# Patient Record
Sex: Male | Born: 1985 | Race: White | Hispanic: No | Smoking: Current every day smoker
Health system: Southern US, Community
[De-identification: ages and names within clinical notes are randomized; demographics above are authoritative.]

## PROBLEM LIST (undated history)

## (undated) DIAGNOSIS — M797 Fibromyalgia: Secondary | ICD-10-CM

## (undated) DIAGNOSIS — F329 Major depressive disorder, single episode, unspecified: Secondary | ICD-10-CM

## (undated) DIAGNOSIS — F32A Depression, unspecified: Secondary | ICD-10-CM

## (undated) DIAGNOSIS — F1910 Other psychoactive substance abuse, uncomplicated: Secondary | ICD-10-CM

## (undated) HISTORY — PX: FACIAL FRACTURE SURGERY: SHX1570

---

## 2009-09-20 ENCOUNTER — Emergency Department: Payer: Self-pay | Admitting: Emergency Medicine

## 2010-02-27 ENCOUNTER — Inpatient Hospital Stay (HOSPITAL_COMMUNITY): Admission: EM | Admit: 2010-02-27 | Discharge: 2010-03-02 | Payer: Self-pay | Admitting: Emergency Medicine

## 2010-02-27 ENCOUNTER — Encounter: Payer: Self-pay | Admitting: Emergency Medicine

## 2010-04-15 ENCOUNTER — Ambulatory Visit (HOSPITAL_COMMUNITY): Admission: RE | Admit: 2010-04-15 | Discharge: 2010-04-15 | Payer: Self-pay | Admitting: Otolaryngology

## 2011-02-07 LAB — CBC
Hemoglobin: 15.9 g/dL (ref 13.0–17.0)
MCHC: 34 g/dL (ref 30.0–36.0)
MCV: 88.2 fL (ref 78.0–100.0)
RBC: 5.3 MIL/uL (ref 4.22–5.81)
RDW: 13.2 % (ref 11.5–15.5)
WBC: 7.3 10*3/uL (ref 4.0–10.5)

## 2012-05-03 ENCOUNTER — Emergency Department: Payer: Self-pay | Admitting: Unknown Physician Specialty

## 2012-05-03 LAB — CBC WITH DIFFERENTIAL/PLATELET
Basophil #: 0.1 10*3/uL (ref 0.0–0.1)
Basophil %: 0.7 %
HCT: 55.9 % — ABNORMAL HIGH (ref 40.0–52.0)
Lymphocyte %: 17.5 %
MCH: 28.7 pg (ref 26.0–34.0)
MCHC: 33.8 g/dL (ref 32.0–36.0)
MCV: 85 fL (ref 80–100)
Monocyte %: 9.1 %
Neutrophil #: 5.6 10*3/uL (ref 1.4–6.5)
RBC: 6.58 10*6/uL — ABNORMAL HIGH (ref 4.40–5.90)
RDW: 13.1 % (ref 11.5–14.5)
WBC: 7.9 10*3/uL (ref 3.8–10.6)

## 2012-05-03 LAB — DRUG SCREEN, URINE
Amphetamines, Ur Screen: NEGATIVE (ref ?–1000)
Barbiturates, Ur Screen: NEGATIVE (ref ?–200)
Benzodiazepine, Ur Scrn: NEGATIVE (ref ?–200)
Cannabinoid 50 Ng, Ur ~~LOC~~: NEGATIVE (ref ?–50)
Cocaine Metabolite,Ur ~~LOC~~: NEGATIVE (ref ?–300)
MDMA (Ecstasy)Ur Screen: NEGATIVE (ref ?–500)

## 2012-05-03 LAB — URINALYSIS, COMPLETE
Bilirubin,UR: NEGATIVE
Glucose,UR: NEGATIVE mg/dL (ref 0–75)
Hyaline Cast: 1
Leukocyte Esterase: NEGATIVE
Nitrite: NEGATIVE
Ph: 6 (ref 4.5–8.0)
Protein: 30
Specific Gravity: 1.013 (ref 1.003–1.030)
WBC UR: <1 /HPF (ref 0–5)

## 2012-05-03 LAB — BASIC METABOLIC PANEL
BUN: 9 mg/dL (ref 7–18)
Calcium, Total: 9.5 mg/dL (ref 8.5–10.1)
Co2: 28 mmol/L (ref 21–32)
Creatinine: 1.22 mg/dL (ref 0.60–1.30)
EGFR (Non-African Amer.): >60
Glucose: 72 mg/dL (ref 65–99)
Osmolality: 273 (ref 275–301)
Potassium: 4 mmol/L (ref 3.5–5.1)

## 2012-05-03 LAB — MAGNESIUM: Magnesium: 2.4 mg/dL

## 2012-05-03 LAB — HEPATIC FUNCTION PANEL A (ARMC)
Albumin: 4.1 g/dL (ref 3.4–5.0)
SGPT (ALT): 66 U/L

## 2012-05-03 LAB — CK TOTAL AND CKMB (NOT AT ARMC)
CK, Total: 455 U/L — ABNORMAL HIGH (ref 35–232)
CK-MB: 5.7 ng/mL — ABNORMAL HIGH (ref 0.5–3.6)

## 2012-06-14 ENCOUNTER — Emergency Department: Payer: Self-pay | Admitting: Emergency Medicine

## 2012-06-15 LAB — URINALYSIS, COMPLETE
Bilirubin,UR: NEGATIVE
Blood: NEGATIVE
Glucose,UR: NEGATIVE mg/dL (ref 0–75)
Ketone: NEGATIVE
Leukocyte Esterase: NEGATIVE
Nitrite: NEGATIVE
Ph: 6 (ref 4.5–8.0)
RBC,UR: 1 /HPF (ref 0–5)

## 2012-06-15 LAB — BASIC METABOLIC PANEL
Calcium, Total: 8.8 mg/dL (ref 8.5–10.1)
EGFR (African American): >60
EGFR (Non-African Amer.): >60
Sodium: 138 mmol/L (ref 136–145)

## 2012-06-15 LAB — CBC WITH DIFFERENTIAL/PLATELET
Basophil #: 0 10*3/uL (ref 0.0–0.1)
Basophil %: 0.5 %
Eosinophil #: 0.1 10*3/uL (ref 0.0–0.7)
Lymphocyte %: 27.9 %
MCH: 30 pg (ref 26.0–34.0)
MCV: 88 fL (ref 80–100)
Monocyte %: 13.1 %
Neutrophil %: 57.4 %
Platelet: 282 10*3/uL (ref 150–440)
RBC: 6.05 10*6/uL — ABNORMAL HIGH (ref 4.40–5.90)
WBC: 7.5 10*3/uL (ref 3.8–10.6)

## 2012-06-15 LAB — DRUG SCREEN, URINE
Amphetamines, Ur Screen: NEGATIVE (ref ?–1000)
Barbiturates, Ur Screen: NEGATIVE (ref ?–200)
Benzodiazepine, Ur Scrn: NEGATIVE (ref ?–200)
MDMA (Ecstasy)Ur Screen: NEGATIVE (ref ?–500)
Methadone, Ur Screen: NEGATIVE (ref ?–300)
Opiate, Ur Screen: NEGATIVE (ref ?–300)
Tricyclic, Ur Screen: NEGATIVE (ref ?–1000)

## 2012-10-16 ENCOUNTER — Emergency Department: Payer: Self-pay | Admitting: Emergency Medicine

## 2012-10-31 ENCOUNTER — Emergency Department: Payer: Self-pay | Admitting: Emergency Medicine

## 2013-01-23 ENCOUNTER — Emergency Department: Payer: Self-pay | Admitting: Internal Medicine

## 2013-01-25 ENCOUNTER — Emergency Department: Payer: Self-pay | Admitting: Emergency Medicine

## 2013-03-09 ENCOUNTER — Emergency Department: Payer: Self-pay | Admitting: Unknown Physician Specialty

## 2013-03-09 LAB — COMPREHENSIVE METABOLIC PANEL
Albumin: 3.9 g/dL (ref 3.4–5.0)
Alkaline Phosphatase: 85 U/L (ref 50–136)
BUN: 12 mg/dL (ref 7–18)
Calcium, Total: 9.1 mg/dL (ref 8.5–10.1)
Chloride: 102 mmol/L (ref 98–107)
Co2: 26 mmol/L (ref 21–32)
EGFR (Non-African Amer.): >60
Osmolality: 269 (ref 275–301)
SGOT(AST): 26 U/L (ref 15–37)
SGPT (ALT): 24 U/L (ref 12–78)

## 2013-03-09 LAB — CBC WITH DIFFERENTIAL/PLATELET
Eosinophil #: 0.1 10*3/uL (ref 0.0–0.7)
HCT: 52.6 % — ABNORMAL HIGH (ref 40.0–52.0)
HGB: 17.7 g/dL (ref 13.0–18.0)
MCH: 29.7 pg (ref 26.0–34.0)
MCHC: 33.7 g/dL (ref 32.0–36.0)
MCV: 88 fL (ref 80–100)
Neutrophil %: 62.1 %
Platelet: 267 10*3/uL (ref 150–440)
RDW: 12.9 % (ref 11.5–14.5)
WBC: 8.4 10*3/uL (ref 3.8–10.6)

## 2013-03-09 LAB — DRUG SCREEN, URINE
Barbiturates, Ur Screen: NEGATIVE (ref ?–200)
Benzodiazepine, Ur Scrn: NEGATIVE (ref ?–200)
Cannabinoid 50 Ng, Ur ~~LOC~~: NEGATIVE (ref ?–50)
Cocaine Metabolite,Ur ~~LOC~~: NEGATIVE (ref ?–300)
MDMA (Ecstasy)Ur Screen: NEGATIVE (ref ?–500)
Opiate, Ur Screen: NEGATIVE (ref ?–300)

## 2013-03-10 LAB — URINALYSIS, COMPLETE
Bacteria: NONE SEEN
Bilirubin,UR: NEGATIVE
Blood: NEGATIVE
Leukocyte Esterase: NEGATIVE
Ph: 6 (ref 4.5–8.0)
Protein: 30
Specific Gravity: 1.017 (ref 1.003–1.030)
WBC UR: 1 /HPF (ref 0–5)

## 2013-03-19 ENCOUNTER — Emergency Department: Payer: Self-pay | Admitting: Emergency Medicine

## 2013-03-20 ENCOUNTER — Emergency Department: Payer: Self-pay | Admitting: Internal Medicine

## 2013-04-09 ENCOUNTER — Emergency Department: Payer: Self-pay | Admitting: Emergency Medicine

## 2013-04-26 ENCOUNTER — Emergency Department: Payer: Self-pay | Admitting: Emergency Medicine

## 2013-05-02 ENCOUNTER — Emergency Department: Payer: Self-pay | Admitting: Emergency Medicine

## 2013-05-02 LAB — DRUG SCREEN, URINE
Amphetamines, Ur Screen: NEGATIVE (ref ?–1000)
Barbiturates, Ur Screen: NEGATIVE (ref ?–200)
Benzodiazepine, Ur Scrn: NEGATIVE (ref ?–200)
Cocaine Metabolite,Ur ~~LOC~~: NEGATIVE (ref ?–300)
Opiate, Ur Screen: NEGATIVE (ref ?–300)
Tricyclic, Ur Screen: NEGATIVE (ref ?–1000)

## 2013-05-02 LAB — COMPREHENSIVE METABOLIC PANEL
Anion Gap: 8 (ref 7–16)
BUN: 10 mg/dL (ref 7–18)
Bilirubin,Total: 0.4 mg/dL (ref 0.2–1.0)
Calcium, Total: 9.4 mg/dL (ref 8.5–10.1)
Co2: 29 mmol/L (ref 21–32)
Creatinine: 1.12 mg/dL (ref 0.60–1.30)
EGFR (African American): >60
EGFR (Non-African Amer.): >60
SGOT(AST): 14 U/L — ABNORMAL LOW (ref 15–37)
Total Protein: 7.4 g/dL (ref 6.4–8.2)

## 2013-05-02 LAB — CBC
HCT: 49.8 % (ref 40.0–52.0)
HGB: 17.2 g/dL (ref 13.0–18.0)
Platelet: 238 10*3/uL (ref 150–440)

## 2013-05-02 LAB — URINALYSIS, COMPLETE
Bacteria: NONE SEEN
Blood: NEGATIVE
Glucose,UR: NEGATIVE mg/dL (ref 0–75)
Leukocyte Esterase: NEGATIVE
Nitrite: NEGATIVE
Ph: 7 (ref 4.5–8.0)
Protein: NEGATIVE
WBC UR: <1 /HPF (ref 0–5)

## 2013-05-02 LAB — ETHANOL: Ethanol %: <0.003 % (ref 0.000–0.080)

## 2013-06-15 ENCOUNTER — Emergency Department: Payer: Self-pay | Admitting: Emergency Medicine

## 2013-06-17 ENCOUNTER — Emergency Department: Payer: Self-pay | Admitting: Emergency Medicine

## 2013-06-17 LAB — COMPREHENSIVE METABOLIC PANEL
Albumin: 4.1 g/dL (ref 3.4–5.0)
Alkaline Phosphatase: 93 U/L (ref 50–136)
BUN: 10 mg/dL (ref 7–18)
Bilirubin,Total: 0.2 mg/dL (ref 0.2–1.0)
Chloride: 109 mmol/L — ABNORMAL HIGH (ref 98–107)
Creatinine: 1.02 mg/dL (ref 0.60–1.30)
EGFR (African American): >60
Osmolality: 287 (ref 275–301)
Sodium: 145 mmol/L (ref 136–145)

## 2013-06-17 LAB — CBC
HCT: 47.4 % (ref 40.0–52.0)
MCH: 29 pg (ref 26.0–34.0)
MCHC: 34.2 g/dL (ref 32.0–36.0)
Platelet: 248 10*3/uL (ref 150–440)
RBC: 5.59 10*6/uL (ref 4.40–5.90)
RDW: 13.3 % (ref 11.5–14.5)

## 2013-06-17 LAB — URINALYSIS, COMPLETE
Bacteria: NONE SEEN
Bilirubin,UR: NEGATIVE
Blood: NEGATIVE
Glucose,UR: NEGATIVE mg/dL (ref 0–75)
Ketone: NEGATIVE
Leukocyte Esterase: NEGATIVE
Ph: 7 (ref 4.5–8.0)
Protein: NEGATIVE
RBC,UR: 1 /HPF (ref 0–5)
WBC UR: 1 /HPF (ref 0–5)

## 2013-06-17 LAB — TSH: Thyroid Stimulating Horm: 1.37 u[IU]/mL

## 2013-06-17 LAB — DRUG SCREEN, URINE
Amphetamines, Ur Screen: NEGATIVE (ref ?–1000)
Barbiturates, Ur Screen: NEGATIVE (ref ?–200)
Benzodiazepine, Ur Scrn: NEGATIVE (ref ?–200)
Cannabinoid 50 Ng, Ur ~~LOC~~: NEGATIVE (ref ?–50)
Cocaine Metabolite,Ur ~~LOC~~: NEGATIVE (ref ?–300)
MDMA (Ecstasy)Ur Screen: POSITIVE (ref ?–500)
Phencyclidine (PCP) Ur S: NEGATIVE (ref ?–25)
Tricyclic, Ur Screen: NEGATIVE (ref ?–1000)

## 2013-06-17 LAB — ETHANOL
Ethanol %: <0.003 % (ref 0.000–0.080)
Ethanol: <3 mg/dL

## 2013-06-24 ENCOUNTER — Emergency Department: Payer: Self-pay | Admitting: Emergency Medicine

## 2013-06-24 LAB — BASIC METABOLIC PANEL
Anion Gap: 11 (ref 7–16)
BUN: 13 mg/dL (ref 7–18)
Co2: 21 mmol/L (ref 21–32)
Creatinine: 1.04 mg/dL (ref 0.60–1.30)
EGFR (African American): >60
EGFR (Non-African Amer.): >60
Glucose: 74 mg/dL (ref 65–99)
Potassium: 4.5 mmol/L (ref 3.5–5.1)
Sodium: 136 mmol/L (ref 136–145)

## 2013-06-24 LAB — URINALYSIS, COMPLETE
Bilirubin,UR: NEGATIVE
Glucose,UR: NEGATIVE mg/dL (ref 0–75)
Leukocyte Esterase: NEGATIVE
Nitrite: NEGATIVE
RBC,UR: 2 /HPF (ref 0–5)
WBC UR: 3 /HPF (ref 0–5)

## 2013-06-24 LAB — CBC WITH DIFFERENTIAL/PLATELET
Basophil #: 0 10*3/uL (ref 0.0–0.1)
Basophil %: 0.3 %
HGB: 17.7 g/dL (ref 13.0–18.0)
Lymphocyte #: 1.6 10*3/uL (ref 1.0–3.6)
MCH: 30.3 pg (ref 26.0–34.0)
Monocyte #: 0.9 x10 3/mm (ref 0.2–1.0)
Monocyte %: 7.8 %
Neutrophil #: 8.3 10*3/uL — ABNORMAL HIGH (ref 1.4–6.5)
Neutrophil %: 76 %
Platelet: 229 10*3/uL (ref 150–440)
RBC: 5.83 10*6/uL (ref 4.40–5.90)

## 2013-07-02 ENCOUNTER — Emergency Department: Payer: Self-pay | Admitting: Emergency Medicine

## 2013-07-02 LAB — URINALYSIS, COMPLETE
Leukocyte Esterase: NEGATIVE
Nitrite: NEGATIVE
Protein: NEGATIVE
RBC,UR: NONE SEEN /HPF (ref 0–5)
Specific Gravity: 1.018 (ref 1.003–1.030)
Squamous Epithelial: NONE SEEN
WBC UR: NONE SEEN /HPF (ref 0–5)

## 2013-07-02 LAB — CBC
HGB: 17.1 g/dL (ref 13.0–18.0)
MCH: 29.7 pg (ref 26.0–34.0)
MCHC: 34.6 g/dL (ref 32.0–36.0)
MCV: 86 fL (ref 80–100)
Platelet: 235 10*3/uL (ref 150–440)
RBC: 5.75 10*6/uL (ref 4.40–5.90)
RDW: 13.4 % (ref 11.5–14.5)

## 2013-07-02 LAB — COMPREHENSIVE METABOLIC PANEL
Albumin: 4.3 g/dL (ref 3.4–5.0)
Anion Gap: 4 — ABNORMAL LOW (ref 7–16)
Bilirubin,Total: 0.4 mg/dL (ref 0.2–1.0)
Calcium, Total: 9.5 mg/dL (ref 8.5–10.1)
Chloride: 108 mmol/L — ABNORMAL HIGH (ref 98–107)
Co2: 29 mmol/L (ref 21–32)
EGFR (African American): >60
EGFR (Non-African Amer.): >60
Glucose: 126 mg/dL — ABNORMAL HIGH (ref 65–99)
Osmolality: 282 (ref 275–301)
Potassium: 3.8 mmol/L (ref 3.5–5.1)
SGOT(AST): 20 U/L (ref 15–37)
SGPT (ALT): 21 U/L (ref 12–78)
Sodium: 141 mmol/L (ref 136–145)
Total Protein: 7.5 g/dL (ref 6.4–8.2)

## 2013-07-02 LAB — DRUG SCREEN, URINE
Amphetamines, Ur Screen: NEGATIVE (ref ?–1000)
Barbiturates, Ur Screen: NEGATIVE (ref ?–200)
Benzodiazepine, Ur Scrn: NEGATIVE (ref ?–200)
Cocaine Metabolite,Ur ~~LOC~~: NEGATIVE (ref ?–300)
MDMA (Ecstasy)Ur Screen: NEGATIVE (ref ?–500)
Opiate, Ur Screen: POSITIVE (ref ?–300)
Phencyclidine (PCP) Ur S: NEGATIVE (ref ?–25)
Tricyclic, Ur Screen: NEGATIVE (ref ?–1000)

## 2013-07-02 LAB — ETHANOL: Ethanol: <3 mg/dL

## 2013-07-02 LAB — TSH: Thyroid Stimulating Horm: 1.28 u[IU]/mL

## 2013-11-08 ENCOUNTER — Emergency Department: Payer: Self-pay | Admitting: Emergency Medicine

## 2013-11-16 ENCOUNTER — Encounter (HOSPITAL_COMMUNITY): Payer: Self-pay | Admitting: Emergency Medicine

## 2013-11-16 ENCOUNTER — Emergency Department (HOSPITAL_COMMUNITY)
Admission: EM | Admit: 2013-11-16 | Discharge: 2013-11-16 | Disposition: A | Discharge status: Home / Self Care | Payer: Self-pay | Attending: Emergency Medicine | Admitting: Emergency Medicine

## 2013-11-16 DIAGNOSIS — K089 Disorder of teeth and supporting structures, unspecified: Secondary | ICD-10-CM | POA: Insufficient documentation

## 2013-11-16 DIAGNOSIS — Z8781 Personal history of (healed) traumatic fracture: Secondary | ICD-10-CM | POA: Insufficient documentation

## 2013-11-16 DIAGNOSIS — K0889 Other specified disorders of teeth and supporting structures: Secondary | ICD-10-CM

## 2013-11-16 DIAGNOSIS — K0381 Cracked tooth: Secondary | ICD-10-CM | POA: Insufficient documentation

## 2013-11-16 DIAGNOSIS — R609 Edema, unspecified: Secondary | ICD-10-CM | POA: Insufficient documentation

## 2013-11-16 MED ORDER — TRAMADOL HCL 50 MG PO TABS: 50.0000 mg | ORAL_TABLET | Freq: Four times a day (QID) | ORAL | Status: DC | PRN

## 2013-11-16 MED ORDER — PENICILLIN V POTASSIUM 500 MG PO TABS: 500.0000 mg | ORAL_TABLET | Freq: Four times a day (QID) | ORAL | Status: AC

## 2013-11-16 NOTE — ED Notes (Signed)
Pt states he has had extensive facial/dental surgery here, pt c/o L lower jaw pain after breaking tooth 4-6 weeks ago.

## 2013-11-16 NOTE — ED Notes (Signed)
Pt presents with c/o dental pain, upper left side of mouth.

## 2013-11-16 NOTE — ED Provider Notes (Signed)
CSN: [id number]     Arrival date & time 11/16/13  1909 History  This chart was scribed for non-physician practitioner, Ivonne Andrew, PA-C,working with Junius Argyle, MD, by Karle Plumber, ED Scribe.  This patient was seen in room WTR9/WTR9 and the patient's care was started at 10:35 PM.  Chief Complaint  Patient presents with  . Dental Pain   The history is provided by the patient. No language interpreter was used.   HPI Comments:  Matthew Gould is a 27 y.o. male who presents to the Emergency Department complaining of left jaw pain secondary to breaking the upper left 1st premolar approximately one month ago. Pt states for the past five days the throbbing pain has been worsening. He reports a bad taste in his mouth also. He reports using warm water rinses with no relief. He reports taking Motrin and Tylenol for pain with no relief. Pt states he has a Education officer, community in [place] but cannot go see her until he pays a bill secondary to extensive facial/dental surgery. He denies fever, chills, or diaphoresis. 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 No other aggravating or alleviating factors. No other associated symptoms.   History reviewed. No pertinent past medical history. Past Surgical History  Procedure Laterality Date  . Facial fracture surgery     No family history on file. History  Substance Use Topics  . Smoking status: Not on file  . Smokeless tobacco: Not on file  . Alcohol Use: Not on file    Review of Systems  Constitutional: Negative for fever and chills.  HENT: Positive for dental problem and facial swelling.   All other systems reviewed and are negative.   Allergies  Review of patient's allergies indicates no known allergies.  Home Medications   Current Outpatient Rx  Name  Route  Sig  Dispense  Refill  . acetaminophen (TYLENOL) 325 MG tablet   Oral   Take 650 mg by mouth every 6 (six) hours as needed for mild pain or moderate pain.         Marland Kitchen  ibuprofen (ADVIL,MOTRIN) 800 MG tablet   Oral   Take 800 mg by mouth every 8 (eight) hours as needed for mild pain or moderate pain.          Triage Vitals: BP 126/100  Pulse 128  Temp(Src) 98.7 F (37.1 C) (Oral)  Resp 18  Wt 155 lb (70.308 kg)  SpO2 100% Physical Exam  Nursing note and vitals reviewed. Constitutional: He is oriented to person, place, and time. He appears well-developed and well-nourished.  HENT:  Head: Normocephalic and atraumatic.  Mouth/Throat: Dentures: Broken and darkening of left upper premolar.   Broken and darkening of left upper premolar.  Eyes: EOM are normal.  Neck: Normal range of motion.  Cardiovascular: Normal rate.   Pulmonary/Chest: Effort normal.  Musculoskeletal: Normal range of motion.  Neurological: He is alert and oriented to person, place, and time.  Skin: Skin is warm and dry.  Psychiatric: He has a normal mood and affect. His behavior is normal.    ED Course  Procedures  DIAGNOSTIC STUDIES: Oxygen Saturation is 100% on RA, normal by my interpretation.   COORDINATION OF CARE: 10:43 PM- Will prescribe antibiotics. Offered pt a dental block but pt declined. Will give pt a dental referral. Pt verbalizes understanding and agrees to plan.    MDM   1. Pain, dental      I personally performed the services described in this  documentation, which was scribed in my presence. The recorded information has been reviewed and is accurate.    Angus Seller, PA-C 11/17/13 2245

## 2013-11-18 NOTE — ED Provider Notes (Signed)
Medical screening examination/treatment/procedure(s) were performed by non-physician practitioner and as supervising physician I was immediately available for consultation/collaboration.  EKG Interpretation   None         Junius Argyle, MD 11/18/13 1242

## 2014-01-03 ENCOUNTER — Encounter (HOSPITAL_COMMUNITY): Payer: Self-pay | Admitting: Emergency Medicine

## 2014-01-03 ENCOUNTER — Emergency Department (HOSPITAL_COMMUNITY)
Admission: EM | Admit: 2014-01-03 | Discharge: 2014-01-04 | Disposition: A | Discharge status: Home / Self Care | Payer: Self-pay | Attending: Emergency Medicine | Admitting: Emergency Medicine

## 2014-01-03 DIAGNOSIS — IMO0001 Reserved for inherently not codable concepts without codable children: Secondary | ICD-10-CM | POA: Insufficient documentation

## 2014-01-03 DIAGNOSIS — F329 Major depressive disorder, single episode, unspecified: Secondary | ICD-10-CM | POA: Insufficient documentation

## 2014-01-03 DIAGNOSIS — F17200 Nicotine dependence, unspecified, uncomplicated: Secondary | ICD-10-CM | POA: Insufficient documentation

## 2014-01-03 DIAGNOSIS — F1110 Opioid abuse, uncomplicated: Secondary | ICD-10-CM

## 2014-01-03 DIAGNOSIS — F3289 Other specified depressive episodes: Secondary | ICD-10-CM | POA: Insufficient documentation

## 2014-01-03 DIAGNOSIS — F1120 Opioid dependence, uncomplicated: Secondary | ICD-10-CM | POA: Insufficient documentation

## 2014-01-03 DIAGNOSIS — F1310 Sedative, hypnotic or anxiolytic abuse, uncomplicated: Secondary | ICD-10-CM | POA: Insufficient documentation

## 2014-01-03 DIAGNOSIS — Z79899 Other long term (current) drug therapy: Secondary | ICD-10-CM | POA: Insufficient documentation

## 2014-01-03 DIAGNOSIS — F1410 Cocaine abuse, uncomplicated: Secondary | ICD-10-CM | POA: Insufficient documentation

## 2014-01-03 HISTORY — DX: Major depressive disorder, single episode, unspecified: F32.9

## 2014-01-03 HISTORY — DX: Depression, unspecified: F32.A

## 2014-01-03 HISTORY — DX: Fibromyalgia: M79.7

## 2014-01-03 HISTORY — DX: Other psychoactive substance abuse, uncomplicated: F19.10

## 2014-01-03 LAB — RAPID URINE DRUG SCREEN, HOSP PERFORMED
AMPHETAMINES: NOT DETECTED
BENZODIAZEPINES: POSITIVE — AB
Barbiturates: NOT DETECTED
COCAINE: NOT DETECTED
OPIATES: POSITIVE — AB
Tetrahydrocannabinol: POSITIVE — AB

## 2014-01-03 LAB — COMPREHENSIVE METABOLIC PANEL
ALT: 16 U/L (ref 0–53)
AST: 24 U/L (ref 0–37)
Albumin: 4.7 g/dL (ref 3.5–5.2)
Alkaline Phosphatase: 86 U/L (ref 39–117)
BILIRUBIN TOTAL: 0.6 mg/dL (ref 0.3–1.2)
BUN: 9 mg/dL (ref 6–23)
CHLORIDE: 95 meq/L — AB (ref 96–112)
CO2: 27 mEq/L (ref 19–32)
CREATININE: 1.03 mg/dL (ref 0.50–1.35)
Calcium: 10.1 mg/dL (ref 8.4–10.5)
GFR calc Af Amer: >90 mL/min (ref >90)
GFR calc non Af Amer: >90 mL/min (ref >90)
Glucose, Bld: 104 mg/dL — ABNORMAL HIGH (ref 70–99)
Potassium: 3.8 mEq/L (ref 3.7–5.3)
Sodium: 136 mEq/L — ABNORMAL LOW (ref 137–147)
Total Protein: 8.6 g/dL — ABNORMAL HIGH (ref 6.0–8.3)

## 2014-01-03 LAB — CBC
HCT: 56.2 % — ABNORMAL HIGH (ref 39.0–52.0)
Hemoglobin: 19.7 g/dL — ABNORMAL HIGH (ref 13.0–17.0)
MCH: 30.3 pg (ref 26.0–34.0)
MCHC: 35.1 g/dL (ref 30.0–36.0)
MCV: 86.5 fL (ref 78.0–100.0)
PLATELETS: 263 10*3/uL (ref 150–400)
RBC: 6.5 MIL/uL — ABNORMAL HIGH (ref 4.22–5.81)
RDW: 13.3 % (ref 11.5–15.5)
WBC: 12.5 10*3/uL — AB (ref 4.0–10.5)

## 2014-01-03 LAB — ETHANOL: Alcohol, Ethyl (B): <11 mg/dL (ref 0–11)

## 2014-01-03 LAB — ACETAMINOPHEN LEVEL

## 2014-01-03 LAB — SALICYLATE LEVEL

## 2014-01-03 MED ORDER — NICOTINE 21 MG/24HR TD PT24: 21.0000 mg | MEDICATED_PATCH | Freq: Every day | TRANSDERMAL | Status: DC

## 2014-01-03 MED ORDER — CLONIDINE HCL 0.1 MG PO TABS: 0.1000 mg | ORAL_TABLET | Freq: Once | ORAL | Status: DC

## 2014-01-03 MED ORDER — ZOLPIDEM TARTRATE 5 MG PO TABS: 5.0000 mg | ORAL_TABLET | Freq: Every evening | ORAL | Status: DC | PRN

## 2014-01-03 MED ORDER — ONDANSETRON 4 MG PO TBDP: 8.0000 mg | ORAL_TABLET | Freq: Once | ORAL | Status: DC

## 2014-01-03 MED ORDER — ONDANSETRON HCL 4 MG PO TABS
4.0000 mg | ORAL_TABLET | Freq: Three times a day (TID) | ORAL | Status: DC | PRN
  Administered 2014-01-04: 4 mg via ORAL
  Filled 2014-01-03: qty 1

## 2014-01-03 MED ORDER — LORAZEPAM 1 MG PO TABS
1.0000 mg | ORAL_TABLET | Freq: Three times a day (TID) | ORAL | Status: DC | PRN
  Administered 2014-01-04: 1 mg via ORAL
  Filled 2014-01-03: qty 1

## 2014-01-03 NOTE — ED Notes (Signed)
Requests detox from oxycodone.  Last had 60 mg at 8am.  C/o generalized pain all over from fibromyalgia.  Denies etoh.  Denies sucidal/homicidal ideation.

## 2014-01-03 NOTE — BH Assessment (Signed)
Matthew Gould Kelly, PA working with pt to gather additional information prior to assessing pt. Contacted Matthew Gould, assigned nurse who agreed to set up tele-assessment. Assessment to be initiated.  Matthew Gould, MSW, LCSW Triage Specialist [phone]

## 2014-01-03 NOTE — BH Assessment (Signed)
Clinician consulted with Matthew SamFran Hobson NP who states Pt meets criteria for inpatient detox. Matthew Gould, [name]. Clinician contacted Matthew Gould, [place] with recommendation.TTS will look for alternative placements.    Matthew Gould, MSW, LCSW Triage Specialist [phone]

## 2014-01-03 NOTE — ED Provider Notes (Signed)
CSN: [id number]     Arrival date & time 01/03/14  1907 History   First MD Initiated Contact with Patient 01/03/14 2025     Chief Complaint  Patient presents with  . detox     (Consider location/radiation/quality/duration/timing/severity/associated sxs/prior Treatment) HPI Comments: Patient is a 28 year old male who presents for detox and placement in a rehabilitation facility for substance abuse. Patient states that he has been using oxycodone for some time. He endorses daily use of approximately 200-300 mg twice a day. Patient takes oxycodone orally; he has been snorting it as well for the last 2 weeks. Patient endorses cocaine use one week ago as well as alcohol use one week ago, though he states he does not usually use illicit drugs or alcohol. He denies suicidal and homicidal ideations as well as auditory or visual hallucinations. He has no pain complaints at this time. He denies any withdrawal symptoms at present. Patient last used oxycodone approximately 12 hours ago. He denies prior treatment in a rehabilitation facility.  The history is provided by the patient. No language interpreter was used.    Past Medical History  Diagnosis Date  . Drug abuse   . Fibromyalgia   . Depression    Past Surgical History  Procedure Laterality Date  . Facial fracture surgery     No family history on file. History  Substance Use Topics  . Smoking status: Current Every Day Smoker  . Smokeless tobacco: Not on file  . Alcohol Use: Yes    Review of Systems  Psychiatric/Behavioral: Positive for behavioral problems (substance abuse).  All other systems reviewed and are negative.     Allergies  Review of patient's allergies indicates no known allergies.  Home Medications   Current Outpatient Rx  Name  Route  Sig  Dispense  Refill  . ibuprofen (ADVIL,MOTRIN) 800 MG tablet   Oral   Take 800 mg by mouth every 8 (eight) hours as needed for mild pain or moderate pain.         . pregabalin  (LYRICA) 75 MG capsule   Oral   Take 75 mg by mouth 3 (three) times daily.         . traMADol (ULTRAM) 50 MG tablet   Oral   Take 50 mg by mouth every 6 (six) hours as needed for moderate pain.          BP 135/79  Pulse 73  Temp(Src) 97.8 F (36.6 C) (Oral)  Resp 16  SpO2 98%  Physical Exam  Nursing note and vitals reviewed. Constitutional: He is oriented to person, place, and time. He appears well-developed and well-nourished. No distress.  HENT:  Head: Normocephalic and atraumatic.  Eyes: Conjunctivae and EOM are normal. No scleral icterus.  Neck: Normal range of motion.  Cardiovascular: Normal rate, regular rhythm and normal heart sounds.   Pulmonary/Chest: Effort normal and breath sounds normal. No respiratory distress. He has no wheezes. He has no rales.  Abdominal: Soft. He exhibits no distension. There is no tenderness. There is no rebound and no guarding.  Musculoskeletal: Normal range of motion.  Neurological: He is alert and oriented to person, place, and time.  Skin: Skin is warm and dry. No rash noted. He is not diaphoretic. No erythema. No pallor.  Psychiatric: He has a normal mood and affect. His speech is normal and behavior is normal. Cognition and memory are normal. He expresses no homicidal and no suicidal ideation. He expresses no suicidal plans and no homicidal plans.  Affect and behavior normal at this time. No visible agitation.    ED Course  Procedures (including critical care time) Labs Review Labs Reviewed  CBC - Abnormal; Notable for the following:    WBC 12.5 (*)    RBC 6.50 (*)    Hemoglobin 19.7 (*)    HCT 56.2 (*)    All other components within normal limits  COMPREHENSIVE METABOLIC PANEL - Abnormal; Notable for the following:    Sodium 136 (*)    Chloride 95 (*)    Glucose, Bld 104 (*)    Total Protein 8.6 (*)    All other components within normal limits  SALICYLATE LEVEL - Abnormal; Notable for the following:    Salicylate Lvl <2.0  (*)    All other components within normal limits  URINE RAPID DRUG SCREEN (HOSP PERFORMED) - Abnormal; Notable for the following:    Opiates POSITIVE (*)    Benzodiazepines POSITIVE (*)    Tetrahydrocannabinol POSITIVE (*)    All other components within normal limits  ACETAMINOPHEN LEVEL  ETHANOL   Imaging Review No results found.  EKG Interpretation   None       MDM   Final diagnoses:  Opiate abuse, continuous    2130 - Patient presents to the emergency department for assistance with detox and placement in a rehabilitation facility for opioid dependence. Patient endorses using 200-300 mg of oxycodone twice a day. Patient last used 12 hours ago. UDS today positive for opiates, benzodiazepines, and THC. No SI/HI or A/V hallucinations. Patient medically cleared for TTS eval.  2230 - Have spoken with behavioral health team. They state that patient does meet criteria for inpatient treatment, however, there are no beds available at this time. They will work on outside placement for patient. Temp psych hold orders placed. Disposition to be determined by oncoming ED provider once placement established.  0320 - Placement pending.  Antony Madura, PA-C 01/04/14 1610  [phone] - Spoke with Ala Dach from [facility]. [facility]; requesting transfer after 8:30AM.   Antony Madura, PA-C 01/04/14 [phone]

## 2014-01-03 NOTE — BH Assessment (Signed)
Tele Assessment Note   Tennis MustJustin T Gould is an 28 y.o. male [facility]   Pt reported "I believe I have a severe addiction problem to opiates." Pt reported using 300-400 mg of oxycodone, 2-3 times a day, daily for almost 4 years. Pt reported use methods are oral and snorting. Pt reported this is affecting his marriage and financially. Pt reported he is currently living with a friend and not living with his wife until he can manage his addiction. Pt reported "All I seem to dream about is taking pills. I don't want to do anything but take pills."  Pt reported experiencing withdrawal symptoms such as agitation, weakness, sweats, chills, tremors, muscle cramps and nausea. Pt denied seizures and blackouts.  Pt also reported depression symptoms contributed by addiction.  Pt presented Ox4. Pt reported depressed mood with congruent affect. Pt denied SI, HI, A/V/H. Pt denied other substance use.  Pt reported no prior inpatient hospitalization. Pt reported being prescribed Lyrica by the [facility]. Pt reported last use was 01/02/14. Pt denied outpatient treatment.    Axis I: Opioid Use Disorder, Severe Axis II: Deferred Axis III:  Past Medical History  Diagnosis Date  . Drug abuse   . Fibromyalgia   . Depression    Axis IV: economic problems and problems with primary support group Axis V: 45  Past Medical History:  Past Medical History  Diagnosis Date  . Drug abuse   . Fibromyalgia   . Depression     Past Surgical History  Procedure Laterality Date  . Facial fracture surgery      Family History: No family history on file.  Social History:  reports that he has been smoking.  He does not have any smokeless tobacco history on file. He reports that he drinks alcohol. He reports that he uses illicit drugs (Oxycodone).  Additional Social History:  Alcohol / Drug Use Pain Medications: Lyrica Prescriptions: none reported Over the Counter: none reported History  of alcohol / drug use?: Yes Longest period of sobriety (when/how long): none Withdrawal Symptoms: Agitation;Weakness;Sweats;Nausea / Vomiting;Tremors Substance #1 Name of Substance 1: Oxycodone 1 - Age of First Use: 23 1 - Amount (size/oz): 300-400 mg doses, 2-3x a day 1 - Frequency: daily 1 - Duration: ongoing 1 - Last Use / Amount: 01/03/14  CIWA: CIWA-Ar BP: 135/79 mmHg Pulse Rate: 73 COWS:    Allergies: No Known Allergies  Home Medications:  (Not in a hospital admission)  OB/GYN Status:  No LMP for male patient.  General Assessment Data [facility] Is this a Tele or Face-to-Face Assessment?: Tele Assessment Is this an Initial Assessment or a Re-assessment for this encounter?: Initial Assessment Living Arrangements: Other (Comment) (Pt reported currently living with friend.) Can pt return to current living arrangement?: Yes Admission Status: Voluntary Is patient capable of signing voluntary admission?: Yes Transfer from: Acute Hospital Referral Source: Self/Family/Friend  Medical Screening Exam [facility] Walk[facility]) Medical Exam completed: Yes  [facility] Living Arrangements: Other (Comment) (Pt reported currently living with friend.) Name of Psychiatrist:  (none reported) Name of Therapist:  (none reported)     Risk to self Suicidal Ideation: No Suicidal Intent: No Is patient at risk for suicide?: No Suicidal Plan?: No Access to Means: No What has been your use of drugs/alcohol within the last 12 months?:  (oxycodone) Previous Attempts/Gestures: No How many times?: 0 Other Self Harm Risks:  (none) Triggers for Past Attempts: None known  Intentional Self Injurious Behavior: None Family Suicide History: Unknown Recent stressful life event(s): Financial Problems;Conflict (Comment) (Pt reported drug use is effecting his marriage.) Persecutory voices/beliefs?: No Depression: Yes Depression Symptoms:  Insomnia;Tearfulness;Fatigue;Isolating;Loss of interest in usual pleasures;Guilt;Feeling worthless/self pity;Feeling angry/irritable (Hopelessness) Substance abuse history and/or treatment for substance abuse?: Yes Suicide prevention information given to non-admitted patients: Not applicable  Risk to Others Homicidal Ideation: No Thoughts of Harm to Others: No Current Homicidal Intent: No Current Homicidal Plan: No Access to Homicidal Means: No Identified Victim:  (none) History of harm to others?: No Assessment of Violence: None Noted Violent Behavior Description:  (Pt was calm and cooperative.) Does patient have access to weapons?: No Criminal Charges Pending?: No Does patient have a court date: No  Psychosis Hallucinations: None noted Delusions: None noted  Mental Status Report Appear/Hygiene: Other (Comment) (Pt was wearing paper scrubs.) Eye Contact: Good Motor Activity: Unremarkable Speech: Logical/coherent Level of Consciousness: Alert Mood: Depressed Affect: Depressed Anxiety Level: Minimal Thought Processes: Coherent;Relevant Judgement: Impaired Orientation: Person;Place;Time;Situation Obsessive Compulsive Thoughts/Behaviors: None  Cognitive Functioning Concentration: Normal Memory: Recent Intact;Remote Intact IQ: Average Insight: Fair Impulse Control: Poor Appetite: Fair Weight Loss: 40 (Pt reported lost 40 lbs over the last year.) Weight Gain: 0 Sleep: Decreased Total Hours of Sleep: 4 Vegetative Symptoms: None  ADLScreening [facility] Assessment Services) Patient's cognitive ability adequate to safely complete daily activities?: Yes Patient able to express need for assistance with ADLs?: Yes Independently performs ADLs?: Yes (appropriate for developmental age)  Prior Inpatient Therapy Prior Inpatient Therapy: No Prior Therapy Dates:  (NA) Prior Therapy Facilty/Provider(s):  (NA) Reason for Treatment:  (NA)  Prior Outpatient Therapy Prior Outpatient  Therapy: No Prior Therapy Dates:  (NA) Prior Therapy Facilty/Provider(s):  (NA) Reason for Treatment:  (NA)  ADL Screening (condition at time of admission) Patient's cognitive ability adequate to safely complete daily activities?: Yes Is the patient deaf or have difficulty hearing?: No Does the patient have difficulty seeing, even when wearing glasses/contacts?: No Does the patient have difficulty concentrating, remembering, or making decisions?: No Patient able to express need for assistance with ADLs?: Yes Does the patient have difficulty dressing or bathing?: No Independently performs ADLs?: Yes (appropriate for developmental age)       Abuse/Neglect Assessment (Assessment to be complete while patient is alone) Physical Abuse: Denies Verbal Abuse: Denies Sexual Abuse: Denies Exploitation of patient/patient's resources: Denies Values / Beliefs Cultural Requests During Hospitalization: None Spiritual Requests During Hospitalization: None   Advance Directives (For Healthcare) Advance Directive: Patient does not have advance directive    Additional Information 1:1 In Past 12 Months?: No CIRT Risk: No Elopement Risk: No Does patient have medical clearance?: Yes    Clinician consulted with Alberteen Sam, NP who states Pt meets criteria for inpatient detox. Tresa Endo, [facility] reported no available beds at [facility]. TTS will look for alternative placements.  Yaakov Guthrie, MSW, LCSW Triage Specialist [phone]  Disposition Initial Assessment Completed for this Encounter: Yes Disposition of Patient: Inpatient treatment program Type of inpatient treatment program: Adult  Stewart,Kerim Statzer R 01/03/2014 10:45 PM

## 2014-01-04 NOTE — BH Assessment (Signed)
[facility] Rosey BathKelly Southard, [facility], adult unit is at [facility]. Contacted the following facilities for placement:  [facility]- Bed is available but their fax machine is out of order. They recommend calling later today. [facility]- Bed is available. Faxed clinical information.   Harlin RainFord Ellis Ria CommentWarrick Jr, LPC, [facility] Triage Specialist

## 2014-01-04 NOTE — ED Notes (Signed)
[facility]Ford [facility], pt has been accepted [facility], but cannot be transferred there until after 0830 on 01/04/14.

## 2014-01-04 NOTE — ED Notes (Signed)
[facility] called and informed of patient need for transport to [facility] around 0830.

## 2014-01-04 NOTE — ED Notes (Signed)
[facility] notified of patient's discharge-- [facility]. Pt awakened, given sweatshirt and shoes to wear over scrubs. Discharged to [facility].

## 2014-01-04 NOTE — ED Notes (Signed)
Pt other belongings placed in pt bin with labels.

## 2014-01-04 NOTE — ED Provider Notes (Signed)
[facility]  Lukka Black R. Rubin PayorPickering, MD 01/04/14 [phone]

## 2014-01-04 NOTE — ED Notes (Signed)
Pt wallet and cellphone placed in valuables envelope placed with security

## 2014-01-04 NOTE — Discharge Instructions (Signed)

## 2014-01-04 NOTE — BH Assessment (Signed)
Received call from Liliane Channelarlene Moore at [facility] [phone]. Pt has been accepted to their detox program and may be transported after 0830. Notified Antony MaduraKelly Humes, PA-C and Roseanne RenoAnna Reabold, RN of acceptance.  Harlin RainFord Ellis Ria CommentWarrick Jr, LPC, [facility] Triage Specialist

## 2014-01-04 NOTE — ED Provider Notes (Signed)
Medical screening examination/treatment/procedure(s) were performed by non-physician practitioner and as supervising physician I was immediately available for consultation/collaboration.  EKG Interpretation   None         Gwyneth SproutWhitney Mirely Pangle, MD 01/04/14 1409

## 2014-01-18 ENCOUNTER — Emergency Department: Payer: Self-pay | Admitting: Emergency Medicine

## 2014-03-26 ENCOUNTER — Emergency Department: Payer: Self-pay | Admitting: Emergency Medicine

## 2014-03-26 LAB — URINALYSIS, COMPLETE
Bilirubin,UR: NEGATIVE
Blood: NEGATIVE
Glucose,UR: NEGATIVE mg/dL (ref 0–75)
KETONE: NEGATIVE
LEUKOCYTE ESTERASE: NEGATIVE
Nitrite: NEGATIVE
PH: 5 (ref 4.5–8.0)
Protein: NEGATIVE
SPECIFIC GRAVITY: 1.029 (ref 1.003–1.030)
Squamous Epithelial: <1

## 2014-03-26 LAB — CBC
HCT: 54.2 % — AB (ref 40.0–52.0)
HGB: 18.4 g/dL — ABNORMAL HIGH (ref 13.0–18.0)
MCH: 29.7 pg (ref 26.0–34.0)
MCHC: 34 g/dL (ref 32.0–36.0)
MCV: 88 fL (ref 80–100)
Platelet: 287 10*3/uL (ref 150–440)
RBC: 6.19 10*6/uL — ABNORMAL HIGH (ref 4.40–5.90)
RDW: 12.5 % (ref 11.5–14.5)
WBC: 7.4 10*3/uL (ref 3.8–10.6)

## 2014-03-26 LAB — ETHANOL: Ethanol: <3 mg/dL

## 2014-03-26 LAB — COMPREHENSIVE METABOLIC PANEL
ALBUMIN: 4.2 g/dL (ref 3.4–5.0)
ALK PHOS: 91 U/L
ALT: 28 U/L (ref 12–78)
ANION GAP: 3 — AB (ref 7–16)
BUN: 11 mg/dL (ref 7–18)
Bilirubin,Total: 0.4 mg/dL (ref 0.2–1.0)
CO2: 31 mmol/L (ref 21–32)
CREATININE: 1.32 mg/dL — AB (ref 0.60–1.30)
Calcium, Total: 9.6 mg/dL (ref 8.5–10.1)
Chloride: 106 mmol/L (ref 98–107)
EGFR (Non-African Amer.): >60
Glucose: 59 mg/dL — ABNORMAL LOW (ref 65–99)
Osmolality: 277 (ref 275–301)
POTASSIUM: 4 mmol/L (ref 3.5–5.1)
SGOT(AST): 30 U/L (ref 15–37)
Sodium: 140 mmol/L (ref 136–145)
Total Protein: 7.9 g/dL (ref 6.4–8.2)

## 2014-03-26 LAB — ACETAMINOPHEN LEVEL: Acetaminophen: <2 ug/mL

## 2014-03-26 LAB — DRUG SCREEN, URINE
Amphetamines, Ur Screen: POSITIVE (ref ?–1000)
Barbiturates, Ur Screen: NEGATIVE (ref ?–200)
Benzodiazepine, Ur Scrn: NEGATIVE (ref ?–200)
Cannabinoid 50 Ng, Ur ~~LOC~~: NEGATIVE (ref ?–50)
Cocaine Metabolite,Ur ~~LOC~~: POSITIVE (ref ?–300)
MDMA (Ecstasy)Ur Screen: NEGATIVE (ref ?–500)
Methadone, Ur Screen: NEGATIVE (ref ?–300)
OPIATE, UR SCREEN: NEGATIVE (ref ?–300)
PHENCYCLIDINE (PCP) UR S: NEGATIVE (ref ?–25)
Tricyclic, Ur Screen: NEGATIVE (ref ?–1000)

## 2014-03-26 LAB — SALICYLATE LEVEL: Salicylates, Serum: 2.3 mg/dL

## 2014-04-12 ENCOUNTER — Emergency Department: Payer: Self-pay | Admitting: Emergency Medicine

## 2014-04-12 LAB — ETHANOL: Ethanol: <3 mg/dL

## 2014-04-12 LAB — URINALYSIS, COMPLETE
BILIRUBIN, UR: NEGATIVE
BLOOD: NEGATIVE
Bacteria: NONE SEEN
Glucose,UR: NEGATIVE mg/dL (ref 0–75)
Leukocyte Esterase: NEGATIVE
Nitrite: NEGATIVE
Ph: 5 (ref 4.5–8.0)
RBC,UR: NONE SEEN /HPF (ref 0–5)
Specific Gravity: 1.028 (ref 1.003–1.030)
Squamous Epithelial: <1
WBC UR: 1 /HPF (ref 0–5)

## 2014-04-12 LAB — CBC
HCT: 49.6 % (ref 40.0–52.0)
HGB: 16.8 g/dL (ref 13.0–18.0)
MCH: 29.5 pg (ref 26.0–34.0)
MCHC: 33.8 g/dL (ref 32.0–36.0)
MCV: 87 fL (ref 80–100)
PLATELETS: 268 10*3/uL (ref 150–440)
RBC: 5.69 10*6/uL (ref 4.40–5.90)
RDW: 13 % (ref 11.5–14.5)
WBC: 8.7 10*3/uL (ref 3.8–10.6)

## 2014-04-12 LAB — ACETAMINOPHEN LEVEL

## 2014-04-12 LAB — COMPREHENSIVE METABOLIC PANEL
AST: 43 U/L — AB (ref 15–37)
Albumin: 4.2 g/dL (ref 3.4–5.0)
Alkaline Phosphatase: 98 U/L
Anion Gap: 8 (ref 7–16)
BUN: 13 mg/dL (ref 7–18)
Bilirubin,Total: 0.6 mg/dL (ref 0.2–1.0)
CALCIUM: 9.2 mg/dL (ref 8.5–10.1)
CHLORIDE: 105 mmol/L (ref 98–107)
Co2: 25 mmol/L (ref 21–32)
Creatinine: 1.19 mg/dL (ref 0.60–1.30)
EGFR (Non-African Amer.): >60
GLUCOSE: 111 mg/dL — AB (ref 65–99)
Osmolality: 276 (ref 275–301)
POTASSIUM: 3.5 mmol/L (ref 3.5–5.1)
SGPT (ALT): 63 U/L (ref 12–78)
Sodium: 138 mmol/L (ref 136–145)
TOTAL PROTEIN: 7.6 g/dL (ref 6.4–8.2)

## 2014-04-12 LAB — DRUG SCREEN, URINE
Amphetamines, Ur Screen: NEGATIVE (ref ?–1000)
BENZODIAZEPINE, UR SCRN: NEGATIVE (ref ?–200)
Barbiturates, Ur Screen: NEGATIVE (ref ?–200)
Cannabinoid 50 Ng, Ur ~~LOC~~: POSITIVE (ref ?–50)
Cocaine Metabolite,Ur ~~LOC~~: POSITIVE (ref ?–300)
MDMA (Ecstasy)Ur Screen: NEGATIVE (ref ?–500)
Methadone, Ur Screen: NEGATIVE (ref ?–300)
OPIATE, UR SCREEN: NEGATIVE (ref ?–300)
Phencyclidine (PCP) Ur S: NEGATIVE (ref ?–25)
Tricyclic, Ur Screen: NEGATIVE (ref ?–1000)

## 2014-04-12 LAB — SALICYLATE LEVEL: Salicylates, Serum: <1.7 mg/dL

## 2014-12-23 IMAGING — CT CT HEAD WITHOUT CONTRAST
1 series · 16 of 28 positions shown, 20 images · non-contrast
Comparison: none

REASON FOR EXAM: seizure fall
COMMENTS:

[Series 2: soft tissue · axial · 0.43mm/px · z∈[-138,-13]mm · 16 of 28 slices shown, 20 images]
[im 2/28  brain]
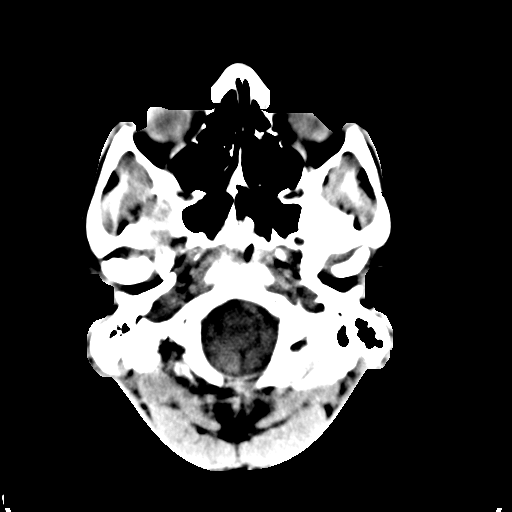
[im 2/28  bone]
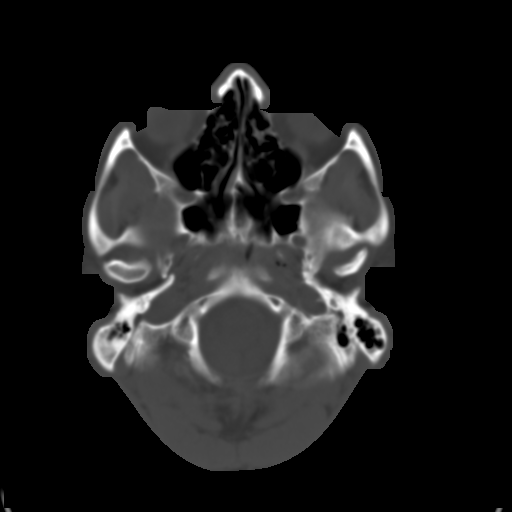
[im 4/28  brain]
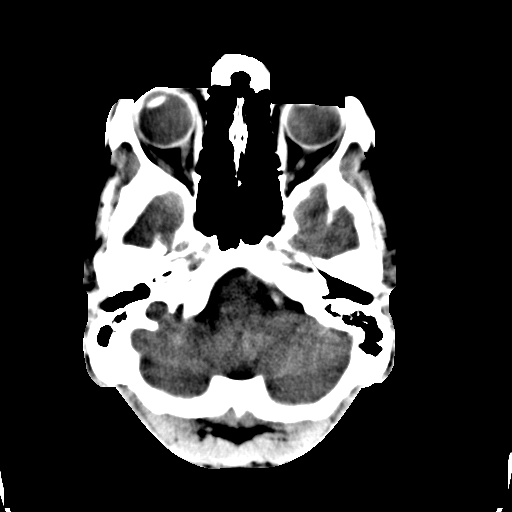
[im 6/28  brain]
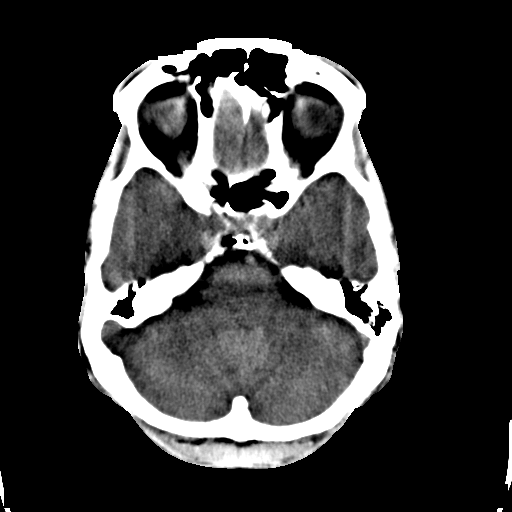
[im 7/28  brain]
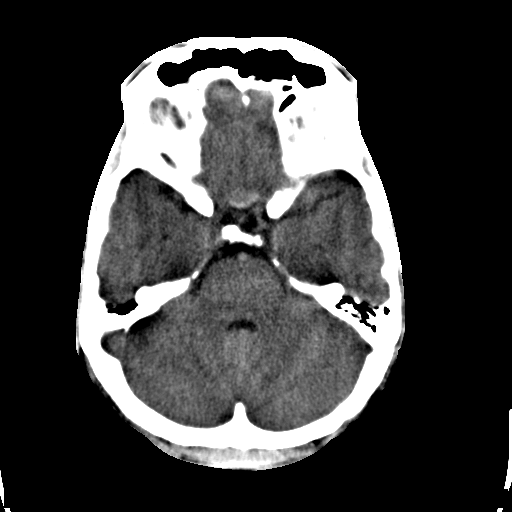
[im 9/28  brain]
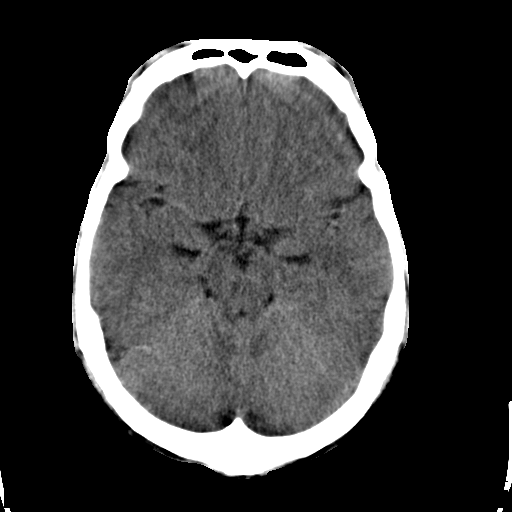
[im 9/28  bone]
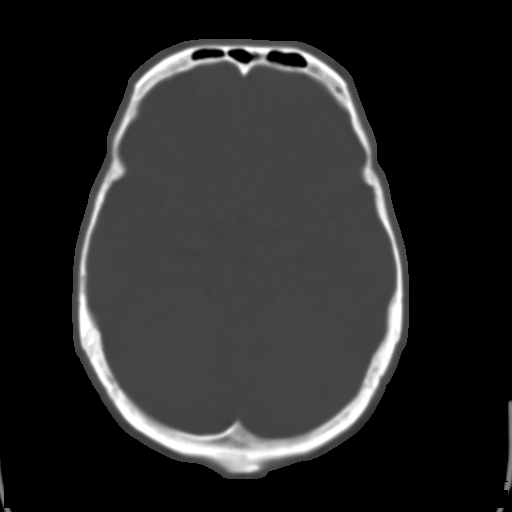
[im 10/28  brain]
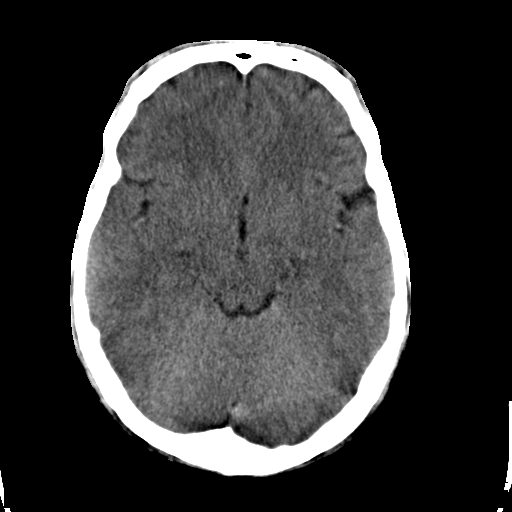
[im 12/28  brain]
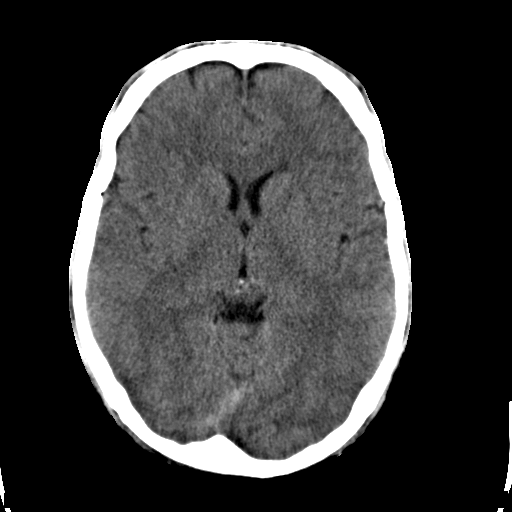
[im 14/28  brain]
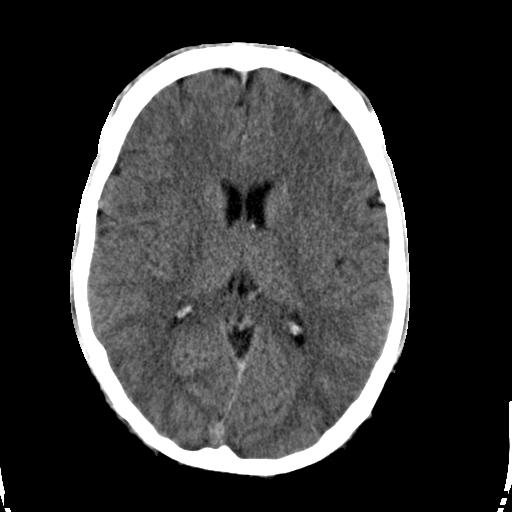
[im 15/28  brain]
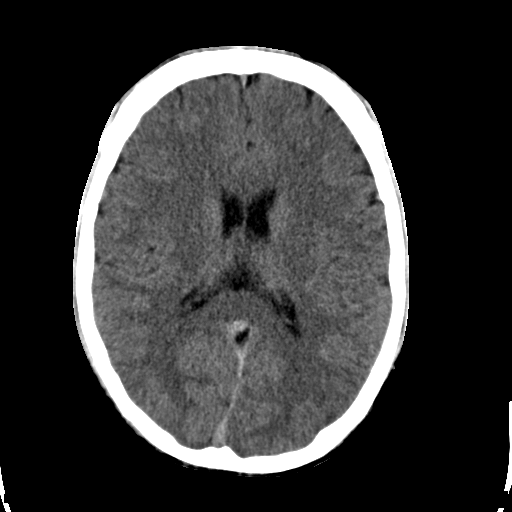
[im 15/28  bone]
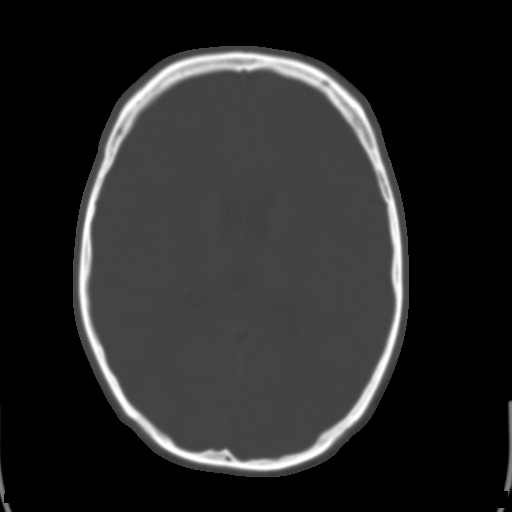
[im 17/28  brain]
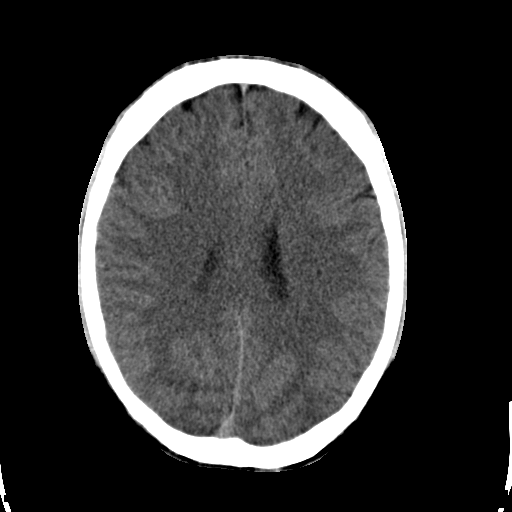
[im 19/28  brain]
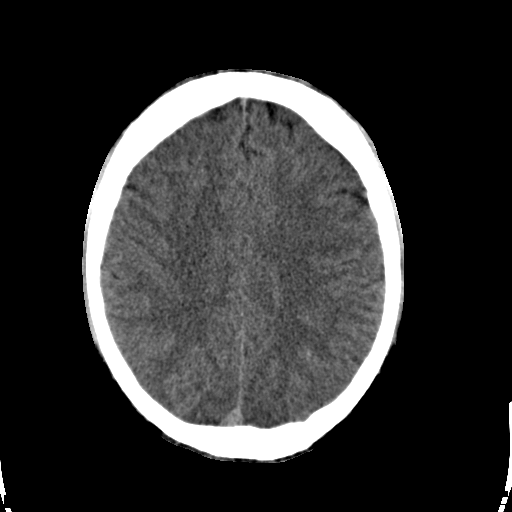
[im 20/28  brain]
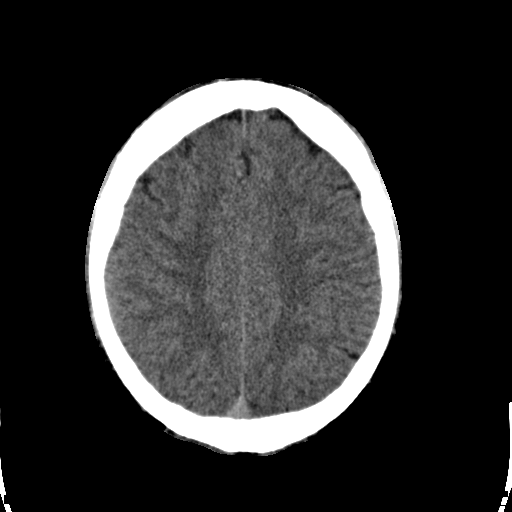
[im 22/28  brain]
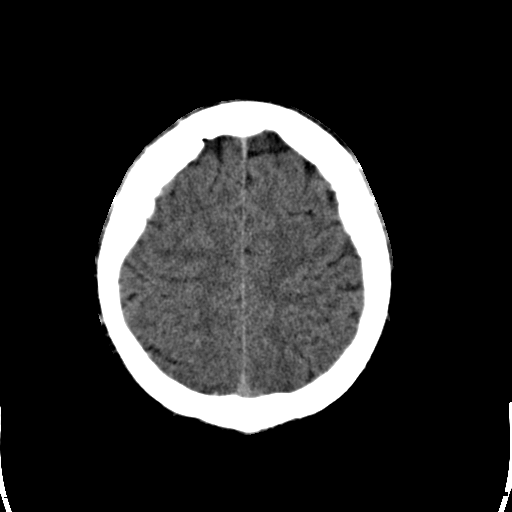
[im 22/28  bone]
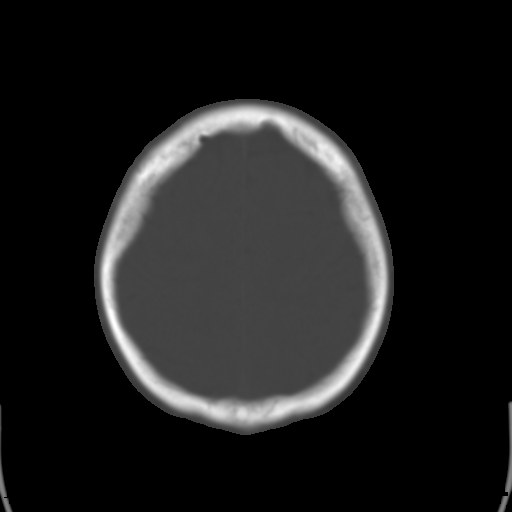
[im 23/28  brain]
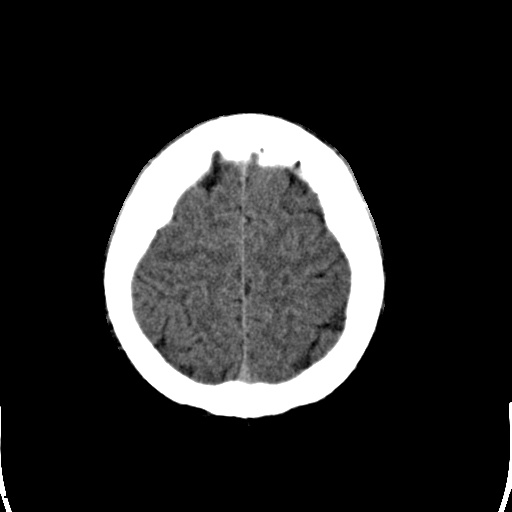
[im 25/28  brain]
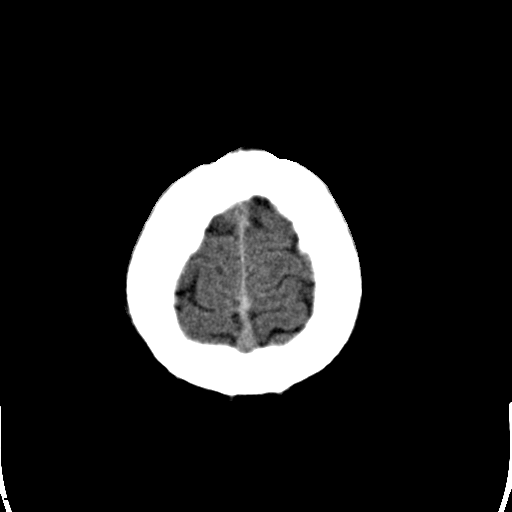
[im 27/28  brain]
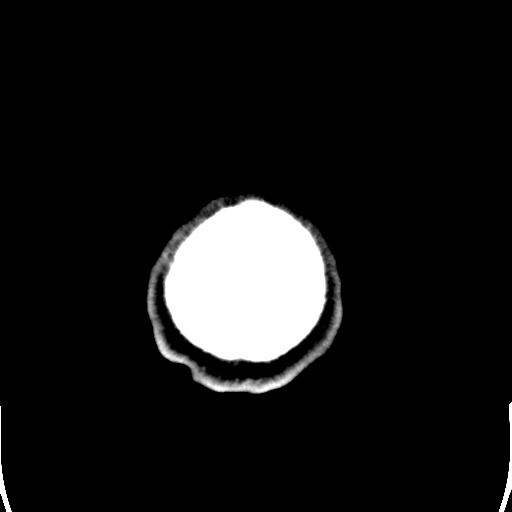

[16 of 28 positions shown; findings below may reference images not displayed]

PROCEDURE:     CT  - CT HEAD WITHOUT CONTRAST  - March 09, 2013 [DATE]

RESULT:     Emergent noncontrast CT of the brain is compared to the previous
exam performed on 03 May, 2012. The ventricles and sulci are normal. There
is no hemorrhage. There is no focal mass, mass-effect or midline shift.
There is no evidence of edema or territorial infarct. The bone windows
demonstrate normal aeration of the paranasal sinuses and mastoid air cells.
There is no skull fracture demonstrated.
IMPRESSION: 1. No acute intracranial abnormality.

[REDACTED]

## 2015-03-14 NOTE — Consult Note (Signed)
Psychiatry: Follow-up for this 29 year old man with opiate dependence and cocaine dependence.  He came into the hospital seeking treatment feeling that he was out of control and was going to die if he didn't stop.  We have been looking into disposition for him.  Patient continues to feel like his substance abuse is out of control.  He is having some opiate withdrawal symptoms especially cramping today.  Patient has not shown any aggressive behavior problems he's been cooperative with treatment. he has no transportation available.  We could get him into the alcohol and drug abuse treatment center but as a voluntary patient we would have no transport.  I have gone ahead and filed involuntary commitment so that he can get transferred to the alcohol and drug abuse treatment center.  [facility] but not as a direct transfer for admission.  We don't have any way to transport him to endure him either.  He is not allowed to go to [facility] from what I am told.  This puts us in a tough situation but after filing an IVC I hope that we can get him transferred to the alcohol and drug abuse treatment Center which would be his best available option right now.  Patient is encouraged to seek anybody who would be willing to give [facility].  Continue to evaluate.  I'm going to give him another 24 hours of Suboxone as part of the detox treatment.  Electronic Signatures: Audery Amellapacs, Greco Gastelum T (MD)  (Signed on 07-May-15 17:02)  Authored  Last Updated: 07-May-15 17:02 by Audery Amellapacs, Naquita Nappier T (MD)

## 2015-03-14 NOTE — Consult Note (Signed)
PATIENT NAME:  Matthew Gould, Matthew Gould MR#:  [id number] DATE OF BIRTH:  [birth year]  DATE OF CONSULTATION:  04/13/2014  CONSULTING PHYSICIAN:  Aalaiyah Yassin K. Guss Bundehalla, MD  PRIORITY PLEASE  SEX: Male.  RACE: White.  SUBJECTIVE: The patient was seen and consulted in [facility]. 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 The patient has a long history of polysubstance abuse and uses cocaine and heroin. [facility] and both of them are refusing to take him back because of him being noncompliant. Today [facility] stating that he needs help with his polysubstance abuse at the appropriate facility.   OBJECTIVE: Alert, oriented, calm, pleasant, and cooperative. Affect is bright and cheerful. Denies feeling depressed and said not now. No psychosis. Does not appear to be responding to internal stimulus. Cognition intact. General knowledge and information fair. Denies suicidal or homicidal plans. Contracts for safety. He is eager to get help from some substance abuse program.  IMPRESSION: Polysubstance abuse, dependence, heroine and cocaine. Chronic continuous, though urine drug screen was negative for heroin.  PLAN: Recommend appropriate placement at the discretion of social services or patient can go to shelter where he can stay until he finds appropriate program for his problems.   ____________________________ Jannet MantisSurya K. Guss Bundehalla, MD skc:lt D: 04/13/2014 21:05:35 ET Gould: 04/14/2014 01:06:16 ET JOB#: [id number]  cc: Monika SalkSurya K. Guss Bundehalla, MD, <Dictator> Beau FannySURYA K Cyndra Feinberg MD ELECTRONICALLY SIGNED 04/14/2014 17:02

## 2015-03-14 NOTE — Consult Note (Signed)
Brief Consult Note: Diagnosis: opiate dependence/cocaine dependence.   Patient was seen by consultant.   Consult note dictated.   Recommend further assessment or treatment.   Orders entered.   Comments: Psychiatry: PAtient presents with opiate (heroin) and cocaine dependence seeking rehab/detox. No suicidal ideation. Not psychotic or agitated. Will order oone-time suboxone to ease withdrawl and prn meds for withdrawl symptoms. Tomorrow [facility].  Electronic Signatures: Audery Amellapacs, Cresencio Reesor T (MD)  (Signed 06-May-15 21:54)  Authored: Brief Consult Note   Last Updated: 06-May-15 21:54 by Audery Amellapacs, Railynn Ballo T (MD)
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
